# Patient Record
Sex: Female | Born: 1970 | Hispanic: Yes | Marital: Married
Health system: Southern US, Community
[De-identification: ages and names within clinical notes are randomized; demographics above are authoritative.]

---

## 2021-02-01 ENCOUNTER — Emergency Department (HOSPITAL_COMMUNITY): Payer: No Typology Code available for payment source

## 2021-02-01 ENCOUNTER — Emergency Department (HOSPITAL_COMMUNITY)
Admission: EM | Admit: 2021-02-01 | Discharge: 2021-02-01 | Disposition: A | Discharge status: Home / Self Care | Payer: No Typology Code available for payment source | Attending: Emergency Medicine | Admitting: Emergency Medicine

## 2021-02-01 DIAGNOSIS — M25552 Pain in left hip: Secondary | ICD-10-CM | POA: Diagnosis not present

## 2021-02-01 DIAGNOSIS — R519 Headache, unspecified: Secondary | ICD-10-CM | POA: Insufficient documentation

## 2021-02-01 DIAGNOSIS — T1490XA Injury, unspecified, initial encounter: Secondary | ICD-10-CM

## 2021-02-01 DIAGNOSIS — M542 Cervicalgia: Secondary | ICD-10-CM | POA: Diagnosis not present

## 2021-02-01 DIAGNOSIS — Y92410 Unspecified street and highway as the place of occurrence of the external cause: Secondary | ICD-10-CM | POA: Insufficient documentation

## 2021-02-01 DIAGNOSIS — M7918 Myalgia, other site: Secondary | ICD-10-CM

## 2021-02-01 DIAGNOSIS — R1012 Left upper quadrant pain: Secondary | ICD-10-CM | POA: Insufficient documentation

## 2021-02-01 LAB — CBC WITH DIFFERENTIAL/PLATELET
Abs Immature Granulocytes: 0.02 10*3/uL (ref 0.00–0.07)
Basophils Absolute: 0 10*3/uL (ref 0.0–0.1)
Basophils Relative: 1 %
Eosinophils Absolute: 0.1 10*3/uL (ref 0.0–0.5)
Eosinophils Relative: 1 %
HCT: 37.7 % (ref 36.0–46.0)
Hemoglobin: 12.6 g/dL (ref 12.0–15.0)
Immature Granulocytes: 0 %
Lymphocytes Relative: 28 %
Lymphs Abs: 1.8 10*3/uL (ref 0.7–4.0)
MCH: 30.8 pg (ref 26.0–34.0)
MCHC: 33.4 g/dL (ref 30.0–36.0)
MCV: 92.2 fL (ref 80.0–100.0)
Monocytes Absolute: 0.5 10*3/uL (ref 0.1–1.0)
Monocytes Relative: 8 %
Neutro Abs: 4 10*3/uL (ref 1.7–7.7)
Neutrophils Relative %: 62 %
Platelets: 285 10*3/uL (ref 150–400)
RBC: 4.09 MIL/uL (ref 3.87–5.11)
RDW: 12.5 % (ref 11.5–15.5)
WBC: 6.5 10*3/uL (ref 4.0–10.5)
nRBC: 0 % (ref 0.0–0.2)

## 2021-02-01 LAB — TYPE AND SCREEN
ABO/RH(D): O POS
Antibody Screen: NEGATIVE

## 2021-02-01 LAB — I-STAT BETA HCG BLOOD, ED (MC, WL, AP ONLY): I-stat hCG, quantitative: <5 m[IU]/mL (ref <5)

## 2021-02-01 LAB — BASIC METABOLIC PANEL
Anion gap: 5 (ref 5–15)
BUN: 13 mg/dL (ref 6–20)
CO2: 25 mmol/L (ref 22–32)
Calcium: 9 mg/dL (ref 8.9–10.3)
Chloride: 105 mmol/L (ref 98–111)
Creatinine, Ser: 1.01 mg/dL — ABNORMAL HIGH (ref 0.44–1.00)
GFR, Estimated: >60 mL/min (ref >60)
Glucose, Bld: 114 mg/dL — ABNORMAL HIGH (ref 70–99)
Potassium: 3.8 mmol/L (ref 3.5–5.1)
Sodium: 135 mmol/L (ref 135–145)

## 2021-02-01 MED ORDER — IOHEXOL 300 MG/ML  SOLN
100.0000 mL | Freq: Once | INTRAMUSCULAR | Status: AC | PRN
  Administered 2021-02-01: 100 mL via INTRAVENOUS

## 2021-02-01 MED ORDER — MORPHINE SULFATE (PF) 4 MG/ML IV SOLN
4.0000 mg | Freq: Once | INTRAVENOUS | Status: AC
Start: 2021-02-01 — End: 2021-02-01
  Administered 2021-02-01: 4 mg via INTRAVENOUS
  Filled 2021-02-01: qty 1

## 2021-02-01 MED ORDER — METHOCARBAMOL 500 MG PO TABS: 500.0000 mg | ORAL_TABLET | Freq: Two times a day (BID) | ORAL | 0 refills | Status: AC

## 2021-02-01 MED ORDER — SODIUM CHLORIDE 0.9 % IV SOLN: INTRAVENOUS | Status: DC

## 2021-02-01 MED ORDER — OXYCODONE-ACETAMINOPHEN 5-325 MG PO TABS: 1.0000 | ORAL_TABLET | ORAL | 0 refills | Status: AC | PRN
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
## 2024-04-19 LAB — COLOGUARD

## 2024-05-14 ENCOUNTER — Other Ambulatory Visit: Payer: Self-pay | Admitting: Nurse Practitioner

## 2024-05-14 DIAGNOSIS — Z1231 Encounter for screening mammogram for malignant neoplasm of breast: Secondary | ICD-10-CM

## 2024-05-22 ENCOUNTER — Ambulatory Visit
Admission: RE | Admit: 2024-05-22 | Discharge: 2024-05-22 | Disposition: A | Discharge status: Home / Self Care | Payer: Self-pay | Source: Ambulatory Visit | Attending: Nurse Practitioner | Admitting: Nurse Practitioner

## 2024-05-22 DIAGNOSIS — Z1231 Encounter for screening mammogram for malignant neoplasm of breast: Secondary | ICD-10-CM
